# Patient Record
Sex: Female | Born: 1990 | Race: White | Hispanic: No | Marital: Married | Smoking: Never smoker
Health system: Southern US, Community
[De-identification: ages and names within clinical notes are randomized; demographics above are authoritative.]

## PROBLEM LIST (undated history)

## (undated) DIAGNOSIS — Z789 Other specified health status: Secondary | ICD-10-CM

## (undated) HISTORY — PX: NO PAST SURGERIES: SHX2092

---

## 2019-07-03 ENCOUNTER — Other Ambulatory Visit: Payer: Self-pay

## 2019-07-03 ENCOUNTER — Inpatient Hospital Stay: Payer: Medicaid Other

## 2019-07-03 ENCOUNTER — Encounter: Payer: Self-pay | Admitting: Obstetrics and Gynecology

## 2019-07-03 ENCOUNTER — Inpatient Hospital Stay
Admission: EM | Admit: 2019-07-03 | Discharge: 2019-07-05 | DRG: 788 | Disposition: A | Discharge status: Home / Self Care | Payer: Medicaid Other | Attending: Obstetrics and Gynecology | Admitting: Obstetrics and Gynecology

## 2019-07-03 DIAGNOSIS — O0930 Supervision of pregnancy with insufficient antenatal care, unspecified trimester: Principal | ICD-10-CM

## 2019-07-03 DIAGNOSIS — Z3A42 42 weeks gestation of pregnancy: Secondary | ICD-10-CM | POA: Diagnosis not present

## 2019-07-03 DIAGNOSIS — O0933 Supervision of pregnancy with insufficient antenatal care, third trimester: Secondary | ICD-10-CM | POA: Diagnosis not present

## 2019-07-03 DIAGNOSIS — Z3490 Encounter for supervision of normal pregnancy, unspecified, unspecified trimester: Secondary | ICD-10-CM

## 2019-07-03 DIAGNOSIS — O99824 Streptococcus B carrier state complicating childbirth: Secondary | ICD-10-CM | POA: Diagnosis present

## 2019-07-03 DIAGNOSIS — Z20822 Contact with and (suspected) exposure to covid-19: Secondary | ICD-10-CM | POA: Diagnosis present

## 2019-07-03 DIAGNOSIS — O4290 Premature rupture of membranes, unspecified as to length of time between rupture and onset of labor, unspecified weeks of gestation: Secondary | ICD-10-CM | POA: Diagnosis present

## 2019-07-03 DIAGNOSIS — O3663X0 Maternal care for excessive fetal growth, third trimester, not applicable or unspecified: Secondary | ICD-10-CM | POA: Diagnosis present

## 2019-07-03 DIAGNOSIS — O480 Post-term pregnancy: Secondary | ICD-10-CM | POA: Diagnosis present

## 2019-07-03 DIAGNOSIS — O4292 Full-term premature rupture of membranes, unspecified as to length of time between rupture and onset of labor: Secondary | ICD-10-CM | POA: Diagnosis present

## 2019-07-03 HISTORY — DX: Other specified health status: Z78.9

## 2019-07-03 LAB — GROUP B STREP BY PCR: Group B strep by PCR: POSITIVE — AB

## 2019-07-03 LAB — RESPIRATORY PANEL BY RT PCR (FLU A&B, COVID)
Influenza A by PCR: NEGATIVE
Influenza B by PCR: NEGATIVE
SARS Coronavirus 2 by RT PCR: NEGATIVE

## 2019-07-03 LAB — RUPTURE OF MEMBRANE (ROM)PLUS: Rom Plus: POSITIVE

## 2019-07-03 MED ORDER — BUTORPHANOL TARTRATE 1 MG/ML IJ SOLN: 1.0000 mg | INTRAMUSCULAR | Status: DC | PRN

## 2019-07-03 MED ORDER — MISOPROSTOL 50MCG HALF TABLET: 50.0000 ug | ORAL_TABLET | ORAL | Status: DC

## 2019-07-03 MED ORDER — OXYTOCIN 40 UNITS IN NORMAL SALINE INFUSION - SIMPLE MED
2.5000 [IU]/h | INTRAVENOUS | Status: DC
  Administered 2019-07-04: 2.5 [IU]/h via INTRAVENOUS
  Filled 2019-07-03: qty 1000

## 2019-07-03 MED ORDER — SODIUM CHLORIDE 0.9 % IV SOLN
2.0000 g | Freq: Once | INTRAVENOUS | Status: AC
  Administered 2019-07-04: 2 g via INTRAVENOUS
  Filled 2019-07-03: qty 2000

## 2019-07-03 MED ORDER — OXYTOCIN BOLUS FROM INFUSION: 500.0000 mL | Freq: Once | INTRAVENOUS | Status: DC

## 2019-07-03 MED ORDER — LIDOCAINE HCL (PF) 1 % IJ SOLN: 30.0000 mL | INTRAMUSCULAR | Status: DC | PRN

## 2019-07-03 MED ORDER — SOD CITRATE-CITRIC ACID 500-334 MG/5ML PO SOLN
30.0000 mL | ORAL | Status: DC | PRN
  Administered 2019-07-04: 03:00:00 30 mL via ORAL
  Filled 2019-07-03: qty 30

## 2019-07-03 MED ORDER — ACETAMINOPHEN 325 MG PO TABS: 650.0000 mg | ORAL_TABLET | ORAL | Status: DC | PRN

## 2019-07-03 MED ORDER — LACTATED RINGERS IV SOLN
500.0000 mL | INTRAVENOUS | Status: DC | PRN
  Administered 2019-07-03: 1000 mL via INTRAVENOUS

## 2019-07-03 MED ORDER — LACTATED RINGERS IV SOLN: INTRAVENOUS | Status: DC

## 2019-07-03 MED ORDER — ONDANSETRON HCL 4 MG/2ML IJ SOLN
4.0000 mg | Freq: Four times a day (QID) | INTRAMUSCULAR | Status: DC | PRN
  Administered 2019-07-04: 03:00:00 4 mg via INTRAVENOUS

## 2019-07-03 NOTE — OB Triage Note (Signed)
Pt is a G2P1 and approx. 42 weeks. Pt reports LOF since 07/01/19 and states it was clear/pink tinge. Pt denies VB. Pt confirms positive fetal movement. Pt states her care has been followed by a midwife and planning to deliver at home. Pt states her first baby was born a day before due date without complication. VSS. Initial FHT 170. Monitors applied and assessing.

## 2019-07-04 ENCOUNTER — Encounter: Payer: Self-pay | Admitting: Obstetrics and Gynecology

## 2019-07-04 ENCOUNTER — Inpatient Hospital Stay: Payer: Medicaid Other | Admitting: Anesthesiology

## 2019-07-04 ENCOUNTER — Encounter
Admission: EM | Disposition: A | Discharge status: Home / Self Care | Payer: Self-pay | Source: Home / Self Care | Attending: Obstetrics and Gynecology

## 2019-07-04 DIAGNOSIS — O4292 Full-term premature rupture of membranes, unspecified as to length of time between rupture and onset of labor: Principal | ICD-10-CM

## 2019-07-04 DIAGNOSIS — O0933 Supervision of pregnancy with insufficient antenatal care, third trimester: Secondary | ICD-10-CM

## 2019-07-04 DIAGNOSIS — Z3A42 42 weeks gestation of pregnancy: Secondary | ICD-10-CM

## 2019-07-04 LAB — CBC
HCT: 25.7 % — ABNORMAL LOW (ref 36.0–46.0)
HCT: 35.9 % — ABNORMAL LOW (ref 36.0–46.0)
Hemoglobin: 11.9 g/dL — ABNORMAL LOW (ref 12.0–15.0)
Hemoglobin: 8.4 g/dL — ABNORMAL LOW (ref 12.0–15.0)
MCH: 30.7 pg (ref 26.0–34.0)
MCH: 30.8 pg (ref 26.0–34.0)
MCHC: 32.7 g/dL (ref 30.0–36.0)
MCHC: 33.1 g/dL (ref 30.0–36.0)
MCV: 92.5 fL (ref 80.0–100.0)
MCV: 94.1 fL (ref 80.0–100.0)
Platelets: 167 10*3/uL (ref 150–400)
Platelets: 227 10*3/uL (ref 150–400)
RBC: 2.73 MIL/uL — ABNORMAL LOW (ref 3.87–5.11)
RBC: 3.88 MIL/uL (ref 3.87–5.11)
RDW: 12.6 % (ref 11.5–15.5)
RDW: 12.7 % (ref 11.5–15.5)
WBC: 13.3 10*3/uL — ABNORMAL HIGH (ref 4.0–10.5)
WBC: 7.5 10*3/uL (ref 4.0–10.5)
nRBC: 0 % (ref 0.0–0.2)
nRBC: 0 % (ref 0.0–0.2)

## 2019-07-04 LAB — DIFFERENTIAL
Abs Immature Granulocytes: 0.02 10*3/uL (ref 0.00–0.07)
Basophils Absolute: 0 10*3/uL (ref 0.0–0.1)
Basophils Relative: 0 %
Eosinophils Absolute: 0.1 10*3/uL (ref 0.0–0.5)
Eosinophils Relative: 1 %
Immature Granulocytes: 0 %
Lymphocytes Relative: 22 %
Lymphs Abs: 1.7 10*3/uL (ref 0.7–4.0)
Monocytes Absolute: 0.5 10*3/uL (ref 0.1–1.0)
Monocytes Relative: 6 %
Neutro Abs: 5.3 10*3/uL (ref 1.7–7.7)
Neutrophils Relative %: 71 %

## 2019-07-04 LAB — URINE DRUG SCREEN, QUALITATIVE (ARMC ONLY)
Amphetamines, Ur Screen: NOT DETECTED
Barbiturates, Ur Screen: NOT DETECTED
Benzodiazepine, Ur Scrn: NOT DETECTED
Cannabinoid 50 Ng, Ur ~~LOC~~: NOT DETECTED
Cocaine Metabolite,Ur ~~LOC~~: NOT DETECTED
MDMA (Ecstasy)Ur Screen: NOT DETECTED
Methadone Scn, Ur: NOT DETECTED
Opiate, Ur Screen: NOT DETECTED
Phencyclidine (PCP) Ur S: NOT DETECTED
Tricyclic, Ur Screen: NOT DETECTED

## 2019-07-04 LAB — RAPID HIV SCREEN (HIV 1/2 AB+AG)
HIV 1/2 Antibodies: NONREACTIVE
HIV-1 P24 Antigen - HIV24: NONREACTIVE

## 2019-07-04 LAB — RPR: RPR Ser Ql: NONREACTIVE

## 2019-07-04 LAB — HEPATITIS B SURFACE ANTIGEN: Hepatitis B Surface Ag: NONREACTIVE

## 2019-07-04 LAB — ABO/RH: ABO/RH(D): O POS

## 2019-07-04 SURGERY — Surgical Case
Anesthesia: Spinal

## 2019-07-04 MED ORDER — NALBUPHINE HCL 10 MG/ML IJ SOLN: 5.0000 mg | Freq: Once | INTRAMUSCULAR | Status: DC | PRN

## 2019-07-04 MED ORDER — ZOLPIDEM TARTRATE 5 MG PO TABS: 5.0000 mg | ORAL_TABLET | Freq: Every evening | ORAL | Status: DC | PRN

## 2019-07-04 MED ORDER — DIPHENHYDRAMINE HCL 25 MG PO CAPS: 25.0000 mg | ORAL_CAPSULE | Freq: Four times a day (QID) | ORAL | Status: DC | PRN

## 2019-07-04 MED ORDER — DEXAMETHASONE SODIUM PHOSPHATE 10 MG/ML IJ SOLN
INTRAMUSCULAR | Status: DC | PRN
  Administered 2019-07-04: 5 mg via INTRAVENOUS

## 2019-07-04 MED ORDER — OXYCODONE-ACETAMINOPHEN 5-325 MG PO TABS: 1.0000 | ORAL_TABLET | ORAL | Status: DC | PRN

## 2019-07-04 MED ORDER — PHENYLEPHRINE 40 MCG/ML (10ML) SYRINGE FOR IV PUSH (FOR BLOOD PRESSURE SUPPORT)
PREFILLED_SYRINGE | INTRAVENOUS | Status: DC | PRN
  Administered 2019-07-04: 100 ug via INTRAVENOUS
  Administered 2019-07-04 (×2): 200 ug via INTRAVENOUS

## 2019-07-04 MED ORDER — OXYCODONE HCL 5 MG PO TABS: 5.0000 mg | ORAL_TABLET | Freq: Once | ORAL | Status: DC | PRN

## 2019-07-04 MED ORDER — CEFAZOLIN SODIUM-DEXTROSE 2-4 GM/100ML-% IV SOLN
2.0000 g | INTRAVENOUS | Status: AC
  Administered 2019-07-04: 03:00:00 2 g via INTRAVENOUS
  Filled 2019-07-04: qty 100

## 2019-07-04 MED ORDER — LACTATED RINGERS IV SOLN: INTRAVENOUS | Status: DC

## 2019-07-04 MED ORDER — ACETAMINOPHEN 325 MG PO TABS: 650.0000 mg | ORAL_TABLET | Freq: Four times a day (QID) | ORAL | Status: DC

## 2019-07-04 MED ORDER — KETOROLAC TROMETHAMINE 30 MG/ML IJ SOLN
30.0000 mg | Freq: Four times a day (QID) | INTRAMUSCULAR | Status: AC
  Filled 2019-07-04: qty 1

## 2019-07-04 MED ORDER — FENTANYL CITRATE (PF) 100 MCG/2ML IJ SOLN: 25.0000 ug | INTRAMUSCULAR | Status: DC | PRN

## 2019-07-04 MED ORDER — OXYCODONE HCL 5 MG/5ML PO SOLN: 5.0000 mg | Freq: Once | ORAL | Status: DC | PRN

## 2019-07-04 MED ORDER — PRENATAL MULTIVITAMIN CH
1.0000 | ORAL_TABLET | Freq: Every day | ORAL | Status: DC
  Administered 2019-07-04: 1 via ORAL
  Filled 2019-07-04: qty 1

## 2019-07-04 MED ORDER — SIMETHICONE 80 MG PO CHEW
80.0000 mg | CHEWABLE_TABLET | Freq: Four times a day (QID) | ORAL | Status: DC
  Administered 2019-07-04 – 2019-07-05 (×4): 80 mg via ORAL
  Filled 2019-07-04 (×4): qty 1

## 2019-07-04 MED ORDER — SENNOSIDES-DOCUSATE SODIUM 8.6-50 MG PO TABS: 2.0000 | ORAL_TABLET | ORAL | Status: DC

## 2019-07-04 MED ORDER — KETOROLAC TROMETHAMINE 30 MG/ML IJ SOLN
INTRAMUSCULAR | Status: DC | PRN
  Administered 2019-07-04: 30 mg via INTRAVENOUS

## 2019-07-04 MED ORDER — DIPHENHYDRAMINE HCL 50 MG/ML IJ SOLN: 12.5000 mg | INTRAMUSCULAR | Status: DC | PRN

## 2019-07-04 MED ORDER — OXYTOCIN 40 UNITS IN NORMAL SALINE INFUSION - SIMPLE MED
INTRAVENOUS | Status: DC | PRN
  Administered 2019-07-04: 1000 mL/h via INTRAVENOUS

## 2019-07-04 MED ORDER — DIPHENHYDRAMINE HCL 25 MG PO CAPS: 25.0000 mg | ORAL_CAPSULE | ORAL | Status: DC | PRN

## 2019-07-04 MED ORDER — ONDANSETRON HCL 4 MG/2ML IJ SOLN: 4.0000 mg | Freq: Three times a day (TID) | INTRAMUSCULAR | Status: DC | PRN

## 2019-07-04 MED ORDER — FENTANYL CITRATE (PF) 100 MCG/2ML IJ SOLN
INTRAMUSCULAR | Status: AC
  Filled 2019-07-04: qty 2

## 2019-07-04 MED ORDER — NALOXONE HCL 4 MG/10ML IJ SOLN
1.0000 ug/kg/h | INTRAVENOUS | Status: DC | PRN
  Filled 2019-07-04: qty 5

## 2019-07-04 MED ORDER — NALBUPHINE HCL 10 MG/ML IJ SOLN: 5.0000 mg | INTRAMUSCULAR | Status: DC | PRN

## 2019-07-04 MED ORDER — LIDOCAINE 5 % EX PTCH
MEDICATED_PATCH | CUTANEOUS | Status: AC
  Filled 2019-07-04: qty 1

## 2019-07-04 MED ORDER — BUPIVACAINE IN DEXTROSE 0.75-8.25 % IT SOLN
INTRATHECAL | Status: DC | PRN
  Administered 2019-07-04: 1.6 mL via INTRATHECAL

## 2019-07-04 MED ORDER — SODIUM CHLORIDE 0.9% FLUSH: 3.0000 mL | INTRAVENOUS | Status: DC | PRN

## 2019-07-04 MED ORDER — OXYCODONE HCL 5 MG PO TABS: 5.0000 mg | ORAL_TABLET | Freq: Four times a day (QID) | ORAL | Status: AC | PRN

## 2019-07-04 MED ORDER — IBUPROFEN 800 MG PO TABS
800.0000 mg | ORAL_TABLET | Freq: Three times a day (TID) | ORAL | Status: DC
  Administered 2019-07-05: 09:00:00 800 mg via ORAL
  Filled 2019-07-04: qty 1

## 2019-07-04 MED ORDER — ACETAMINOPHEN 500 MG PO TABS
1000.0000 mg | ORAL_TABLET | Freq: Four times a day (QID) | ORAL | Status: DC
  Filled 2019-07-04: qty 2

## 2019-07-04 MED ORDER — OXYTOCIN 40 UNITS IN NORMAL SALINE INFUSION - SIMPLE MED: 2.5000 [IU]/h | INTRAVENOUS | Status: AC

## 2019-07-04 MED ORDER — MORPHINE SULFATE (PF) 0.5 MG/ML IJ SOLN
INTRAMUSCULAR | Status: AC
  Filled 2019-07-04: qty 10

## 2019-07-04 MED ORDER — KETOROLAC TROMETHAMINE 30 MG/ML IJ SOLN: 30.0000 mg | Freq: Four times a day (QID) | INTRAMUSCULAR | Status: AC

## 2019-07-04 MED ORDER — FENTANYL CITRATE (PF) 100 MCG/2ML IJ SOLN
INTRAMUSCULAR | Status: DC | PRN
  Administered 2019-07-04: 15 ug via INTRAVENOUS

## 2019-07-04 MED ORDER — SODIUM CHLORIDE 0.9 % IV SOLN
INTRAVENOUS | Status: DC | PRN
  Administered 2019-07-04: 30 ug/min via INTRAVENOUS

## 2019-07-04 MED ORDER — LIDOCAINE 5 % EX PTCH
MEDICATED_PATCH | CUTANEOUS | Status: DC | PRN
  Administered 2019-07-04: 1 via TRANSDERMAL

## 2019-07-04 MED ORDER — MORPHINE SULFATE (PF) 0.5 MG/ML IJ SOLN
INTRAMUSCULAR | Status: DC | PRN
  Administered 2019-07-04: 100 ug via EPIDURAL

## 2019-07-04 MED ORDER — MEPERIDINE HCL 25 MG/ML IJ SOLN: 6.2500 mg | INTRAMUSCULAR | Status: DC | PRN

## 2019-07-04 MED ORDER — MENTHOL 3 MG MT LOZG
1.0000 | LOZENGE | OROMUCOSAL | Status: DC | PRN
  Filled 2019-07-04: qty 9

## 2019-07-04 MED ORDER — NALOXONE HCL 0.4 MG/ML IJ SOLN: 0.4000 mg | INTRAMUSCULAR | Status: DC | PRN

## 2019-07-04 SURGICAL SUPPLY — 26 items
ADHESIVE MASTISOL STRL (MISCELLANEOUS) ×3 IMPLANT
BAG COUNTER SPONGE EZ (MISCELLANEOUS) ×2 IMPLANT
CANISTER SUCT 3000ML PPV (MISCELLANEOUS) ×3 IMPLANT
CHLORAPREP W/TINT 26 (MISCELLANEOUS) ×6 IMPLANT
COUNTER SPONGE BAG EZ (MISCELLANEOUS) ×1
COVER WAND RF STERILE (DRAPES) ×3 IMPLANT
DRSG TELFA 3X8 NADH (GAUZE/BANDAGES/DRESSINGS) ×3 IMPLANT
GAUZE SPONGE 4X4 12PLY STRL (GAUZE/BANDAGES/DRESSINGS) ×3 IMPLANT
GLOVE BIOGEL PI ORTHO PRO 7.5 (GLOVE) ×2
GLOVE PI ORTHO PRO STRL 7.5 (GLOVE) ×1 IMPLANT
GOWN STRL REUS W/ TWL LRG LVL3 (GOWN DISPOSABLE) ×2 IMPLANT
GOWN STRL REUS W/TWL LRG LVL3 (GOWN DISPOSABLE) ×4
KIT TURNOVER KIT A (KITS) ×3 IMPLANT
NS IRRIG 1000ML POUR BTL (IV SOLUTION) ×3 IMPLANT
PACK C SECTION AR (MISCELLANEOUS) ×3 IMPLANT
PAD OB MATERNITY 4.3X12.25 (PERSONAL CARE ITEMS) ×3 IMPLANT
PAD PREP 24X41 OB/GYN DISP (PERSONAL CARE ITEMS) ×3 IMPLANT
PENCIL SMOKE ULTRAEVAC 22 CON (MISCELLANEOUS) ×3 IMPLANT
RETRACTOR WND ALEXIS-O 25 LRG (MISCELLANEOUS) ×1 IMPLANT
RTRCTR C-SECT PINK 25CM LRG (MISCELLANEOUS) ×3 IMPLANT
RTRCTR WOUND ALEXIS O 25CM LRG (MISCELLANEOUS) ×3
SPONGE LAP 18X18 RF (DISPOSABLE) ×3 IMPLANT
SUT VIC AB 0 CTX 36 (SUTURE) ×4
SUT VIC AB 0 CTX36XBRD ANBCTRL (SUTURE) ×2 IMPLANT
SUT VIC AB 1 CT1 36 (SUTURE) ×6 IMPLANT
SUT VICRYL+ 3-0 36IN CT-1 (SUTURE) ×6 IMPLANT

## 2019-07-04 NOTE — Op Note (Signed)
      OP NOTE  Date: 07/04/2019   4:08 AM Name Deanna Spears MR# [id number]  Preoperative Diagnosis: 1. Intrauterine pregnancy at [redacted]w[redacted]d Active Problems:   Pregnancy   PROM (premature rupture of membranes)   Macrosomia  2.  non-reassuring fetal status and hydramnios  Postoperative Diagnosis: 1. Intrauterine pregnancy at [redacted]w[redacted]d, delivered 2. Viable infant 3. Remainder same as pre-op 4. Tight nuchal cord X 2   Procedure: 1. Primary Low-Transverse Cesarean Section  Surgeon: Elonda Husky, MD  Assistant:  Pattricia Boss Thomnpson  No other capable assistant available for this surgery which requires an experienced, high level assistant.   Anesthesia: Spinal    EBL: 450  ml     Findings: 1) female infant, Apgar scores of 2   at 1 minute and 8   at 5 minutes and a birthweight of 133.69  ounces.    2) Normal uterus, tubes and ovaries.    Procedure:  The patient was prepped and draped in the supine position and placed under spinal anesthesia.  A transverse incision was made across the abdomen in a Pfannenstiel manner. If indicated the old scar was systematically removed with sharp dissection.  We carried the dissection down to the level of the fascia.  The fascia was incised in a curvilinear manner.  The fascia was then elevated from the rectus muscles with blunt and sharp dissection.  The rectus muscles were separated laterally exposing the peritoneum.  The peritoneum was carefully entered with care being taken to avoid bowel and bladder.  A self-retaining retractor was placed.  The visceral peritoneum was incised in a curvilinear fashion across the lower uterine segment creating a bladder flap. A transverse incision was made across the lower uterine segment and extended laterally and superiorly using the bandage scissors.  Artificial rupture membranes was performed and a large amount of Clear fluid was noted.  The infant was delivered from the cephalic position.  A very tight nuchal cord  was present X 2. After an appropriate time interval, the cord was doubly clamped and cut. Cord blood was obtained if required.  The infant was handed to the pediatric personnel  who then placed the infant under heat lamps where it was cleaned dried and suctioned as needed. The placenta was delivered. The hysterotomy incision was then identified on ring forceps.  The uterine cavity was cleaned with a moist lap sponge.  The hysterotomy incision was closed with a running interlocking suture of Vicryl.  Hemostasis was excellent.  Pitocin was run in the IV and the uterus was found to be firm. The posterior cul-de-sac and gutters were cleaned and inspected.  Hemostasis was noted.  The fascia was then closed with a running suture of #1 Vicryl.  Hemostasis of the subcutaneous tissues was obtained using the Bovie.  The subcutaneous tissues were closed with a running suture of 000 Vicryl.  A subcuticular suture was placed.  Steri-Strips were applied in the usual manner.  A pressure dressing was placed.  The patient went to the recovery room in stable condition.   Elonda Husky, M.D. 07/04/2019 4:08 AM

## 2019-07-04 NOTE — Lactation Note (Addendum)
This note was copied from a baby's chart. Lactation Consultation Note  Patient Name: Deanna Spears HUDJS'H Date: 07/04/2019 Reason for consult: Initial assessment;Term  Observed mom breast feeding [name] in comfortable position with pillow support.  She has strong, rhythmic, deep suction.  FOB supportive and helpful. Mom recognizing feeding cues and puts [name] to the breast whenever she demonstrates hunger cues.  Mom and baby are 0+ with (+) coombs, but Vivian's first transcutaneous bilirubin at 6 hours was 1.1.   Mom reports tender nipples.  Demonstrated hand expression of several drops of colostrum before and after breast feeding.  Encouraged mom to rub colostrum on nipples after breast feed to prevent bacteria, lubricate and for discomfort.  Coconut oil given and instructed in use.  Mom is experienced breast feeder.  She breast fed her first baby for 13 months.  Right after she came home, mom was readmitted to the hospital.  Mom's sister breast fed the baby until mom was back home, so she had to pump for a while.  Mom's drug screen on admission 07/03/19 was negative, but baby's cord blood was drawn for drug detection panel.  Reviewed normal newborn stomach size, supply and demand, adequate output, normal course of lactation and routine newborn feeding patterns.  Lactation Government social research officer given with contact number and reviewed and lactation name and number written on white board and encouraged to call with any questions, concerns or if needed assistance.  Maternal Data Formula Feeding for Exclusion: No Has patient been taught Hand Expression?: Yes Does the patient have breastfeeding experience prior to this delivery?: Yes  Feeding Feeding Type: Breast Fed  LATCH Score Latch: Grasps breast easily, tongue down, lips flanged, rhythmical sucking.  Audible Swallowing: A few with stimulation  Type of Nipple: Everted at rest and after stimulation  Comfort (Breast/Nipple): Filling,  red/small blisters or bruises, mild/mod discomfort  Hold (Positioning): No assistance needed to correctly position infant at breast.  LATCH Score: 8  Interventions Interventions: Breast feeding basics reviewed;Assisted with latch;Skin to skin;Breast massage;Breast compression;Support pillows;Coconut oil  Lactation Tools Discussed/Used Tools: Coconut oil WIC Program: No   Consult Status Consult Status: PRN    Louis Meckel 07/04/2019, 3:57 PM

## 2019-07-04 NOTE — H&P (Signed)
History and Physical   HPI  Deanna Spears is a 29 y.o. G2P1001 at [redacted]w[redacted]d Estimated Date of Delivery: 06/18/19 who is being admitted for PROM.  Her prenatal care is complicated by lack of prenatal care.  She was attempting to have a home delivery but when she went over [redacted] weeks gestation and her midwife did a test showing ruptured membranes the patient decided to come to the hospital for the remainder of her care. She has had no ultrasounds during this prenatal care and her gestation is unknown.  She was not tested for gestational diabetes during this pregnancy. She underwent an emergent ultrasound here in labor and delivery showing a very macrosomic baby greater than 4500 g and greater than 37 cm of amniotic fluid.  This fluid despite ROM plus being positive.  The patient is not sure but she thinks that she began leaking amniotic fluid on Friday. She denies contractions. She has no available prenatal blood work on admission.   OB History  OB History  Gravida Para Term Preterm AB Living  2 1 1  0 0 1  SAB TAB Ectopic Multiple Live Births  0 0 0 0 1    # Outcome Date GA Lbr Len/2nd Weight Sex Delivery Anes PTL Lv  2 Current           1 Term     F Vag-Spont   LIV     Complications: Short cord    PROBLEM LIST  Pregnancy complications or risks: Patient Active Problem List   Diagnosis Date Noted  . Pregnancy 07/03/2019  . PROM (premature rupture of membranes) 07/03/2019    Prenatal labs and studies: ABO, Rh: --/--/O POS Performed at [facility] [place], [address] [place], [place]  [phone]) Antibody: POS (04/18 2346) Rubella:   RPR:    HBsAg:    HIV: NON REACTIVE (04/18 2346)  09-30-1998-- (04/18 2204)   Past Medical History:  Diagnosis Date  . Medical history non-contributory      Past Surgical History:  Procedure Laterality Date  . NO PAST SURGERIES       Medications    Current Discharge Medication List    CONTINUE these  medications which have NOT CHANGED   Details  ferrous sulfate 325 (65 FE) MG tablet Take 325 mg by mouth daily with breakfast.    Prenatal Vit-Fe Fumarate-FA (PRENATAL MULTIVITAMIN) TABS tablet Take 1 tablet by mouth daily at 12 noon.         Allergies  Patient has no known allergies.  Review of Systems  Pertinent items are noted in HPI.  Physical Exam  BP (!) 108/55   Pulse 81   Temp 98.1 F (36.7 C) (Oral)   Resp 16   Ht 5\' 4"  (1.626 m)   Wt 93 kg   LMP 09/11/2018 (Approximate)   BMI 35.19 kg/m   Lungs:  CTA B Cardio: RRR without M/R/G Abd: Soft, gravid, NT Presentation: cephalic EXT: No C/C/ 1+ Edema DTRs: 2+ B CERVIX: Dilation: 3 Effacement (%): 60 Cervical Position: Posterior, Middle Station: -3 Presentation: Vertex Exam by:: S Sanchez       FHR:  Variability: Fair (1-6 bpm)   category 2-despite greater than 2 hours of fetal monitoring there has been no 15 x 15 accelerations noted. Toco: Uterine Contractions: Occasional but patient is not feeling contractions.  Test Results  Results for orders placed or performed during the hospital encounter of 07/03/19 (from the past 24 hour(s))  ROM Plus ([facility] only)     Status: None   Collection Time: 07/03/19 10:04 PM  Result Value Ref Range   Rom Plus POSITIVE   Group B strep by PCR     Status: Abnormal   Collection Time: 07/03/19 10:04 PM  Result Value Ref Range   Group B strep by PCR POSITIVE (A) NEGATIVE  Respiratory Panel by RT PCR (Flu A&B, Covid) - Nasopharyngeal Swab     Status: None   Collection Time: 07/03/19 11:03 PM   Specimen: Nasopharyngeal Swab  Result Value Ref Range   SARS Coronavirus 2 by RT PCR NEGATIVE NEGATIVE   Influenza A by PCR NEGATIVE NEGATIVE   Influenza B by PCR NEGATIVE NEGATIVE  Urine Drug Screen, Qualitative ([facility] only)     Status: None   Collection Time: 07/03/19 11:27 PM  Result Value Ref Range   Tricyclic, Ur Screen NONE DETECTED NONE DETECTED   Amphetamines, Ur  Screen NONE DETECTED NONE DETECTED   MDMA (Ecstasy)Ur Screen NONE DETECTED NONE DETECTED   Cocaine Metabolite,Ur [place] NONE DETECTED NONE DETECTED   Opiate, Ur Screen NONE DETECTED NONE DETECTED   Phencyclidine (PCP) Ur S NONE DETECTED NONE DETECTED   Cannabinoid 50 Ng, Ur [place] NONE DETECTED NONE DETECTED   Barbiturates, Ur Screen NONE DETECTED NONE DETECTED   Benzodiazepine, Ur Scrn NONE DETECTED NONE DETECTED   Methadone Scn, Ur NONE DETECTED NONE DETECTED  Differential     Status: None   Collection Time: 07/03/19 11:46 PM  Result Value Ref Range   Neutrophils Relative % 71 %   Neutro Abs 5.3 1.7 - 7.7 K/uL   Lymphocytes Relative 22 %   Lymphs Abs 1.7 0.7 - 4.0 K/uL   Monocytes Relative 6 %   Monocytes Absolute 0.5 0.1 - 1.0 K/uL   Eosinophils Relative 1 %   Eosinophils Absolute 0.1 0.0 - 0.5 K/uL   Basophils Relative 0 %   Basophils Absolute 0.0 0.0 - 0.1 K/uL   Immature Granulocytes 0 %   Abs Immature Granulocytes 0.02 0.00 - 0.07 K/uL  Rapid HIV screen (HIV 1/2 Ab+Ag)     Status: None   Collection Time: 07/03/19 11:46 PM  Result Value Ref Range   HIV-1 P24 Antigen - HIV24 NON REACTIVE NON REACTIVE   HIV 1/2 Antibodies NON REACTIVE NON REACTIVE   Interpretation (HIV Ag Ab)      A non reactive test result means that HIV 1 or HIV 2 antibodies and HIV 1 p24 antigen were not detected in the specimen.  CBC     Status: Abnormal   Collection Time: 07/03/19 11:46 PM  Result Value Ref Range   WBC 7.5 4.0 - 10.5 K/uL   RBC 2.73 (L) 3.87 - 5.11 MIL/uL   Hemoglobin 8.4 (L) 12.0 - 15.0 g/dL   HCT 91.4 (L) 78.2 - 95.6 %   MCV 94.1 80.0 - 100.0 fL   MCH 30.8 26.0 - 34.0 pg   MCHC 32.7 30.0 - 36.0 g/dL   RDW 21.3 08.6 - 57.8 %   Platelets 167 150 - 400 K/uL   nRBC 0.0 0.0 - 0.2 %  Type and screen [facility]     Status: None (Preliminary result)   Collection Time: 07/03/19 11:46 PM  Result Value Ref Range   ABO/RH(D) O POS    Antibody Screen POS    Sample  Expiration      07/06/2019,2359 Performed at [facility], [address], [place], [place] [address]  Antibody Identification PENDING   ABO/Rh     Status: None   Collection Time: 07/04/19 12:34 AM  Result Value Ref Range   ABO/RH(D)      O POS Performed at [facility], [place], [place] [address]    Group B Strep positive  Assessment   G2P1001 at [redacted]w[redacted]d Estimated Date of Delivery: 06/18/19  The fetus is reassuring.   Patient Active Problem List   Diagnosis Date Noted  . Pregnancy 07/03/2019  . PROM (premature rupture of membranes) 07/03/2019    Plan  1. Admit to L&D :   2. EFM: -- Category 2 3.  Discussed care with Dr. Annamaria Boots MFM.  His recommendation was for primary cesarean delivery.  Long discussion followed reviewing contingency plans if patient declined.  His major take away was that macrosomia with polyhydramnios is most commonly caused by uncontrolled gestational diabetes and this makes the patient at high risk for shoulder dystocia. 4. Admission labs  -prenatal labs  5.  Antibiotics for GBS 6.  After a lengthy discussion with the parents and the risks and benefits of cesarean delivery versus attempted vaginal delivery they have decided to have a primary cesarean delivery.  All their questions were answered.  Finis Bud, M.D. 07/04/2019 2:18 AM

## 2019-07-04 NOTE — Progress Notes (Addendum)
Transferred pt to [facility] post primary c/s. Pt stable upon transport. RN accompanied pt. Pt denies any further needs at this time.

## 2019-07-04 NOTE — Anesthesia Post-op Follow-up Note (Signed)
  Anesthesia Pain Follow-up Note  Patient: Deanna Spears  Day #: 1  Date of Follow-up: 07/04/2019 Time: 8:26 AM  Last Vitals:  Vitals:   07/04/19 0707 07/04/19 0708  BP:    Pulse: 80 79  Resp:    Temp:    SpO2: 93% 93%    Level of Consciousness: alert  Pain: mild   Side Effects:None  Catheter Site Exam:clean     Plan: D/C from anesthesia care at surgeon's request  Rica Mast

## 2019-07-04 NOTE — Transfer of Care (Signed)
Immediate Anesthesia Transfer of Care Note  Patient: Deanna Spears  Procedure(s) Performed: CESAREAN SECTION (N/A )  Patient Location: PACU  Anesthesia Type:Spinal  Level of Consciousness: awake, alert  and sedated  Airway & Oxygen Therapy: Patient Spontanous Breathing  Post-op Assessment: Report given to RN and Post -op Vital signs reviewed and stable  Post vital signs: Reviewed and stable  Last Vitals:  Vitals Value Taken Time  BP    Temp    Pulse 96 07/04/19 0427  Resp 15 07/04/19 0427  SpO2 97 % 07/04/19 0427  Vitals shown include unvalidated device data.  Last Pain:  Vitals:   07/04/19 0218  TempSrc: Oral  PainSc: 0-No pain         Complications: No apparent anesthesia complications

## 2019-07-04 NOTE — Anesthesia Preprocedure Evaluation (Signed)
Anesthesia Evaluation  Patient identified by MRN, date of birth, ID band Patient awake    Reviewed: Allergy & Precautions, H&P , NPO status , Patient's Chart, lab work & pertinent test results  History of Anesthesia Complications Negative for: history of anesthetic complications  Airway Mallampati: III  TM Distance: >3 FB Neck ROM: full    Dental  (+) Chipped   Pulmonary neg pulmonary ROS, neg shortness of breath,           Cardiovascular Exercise Tolerance: Good (-) hypertensionnegative cardio ROS       Neuro/Psych    GI/Hepatic negative GI ROS,   Endo/Other    Renal/GU   negative genitourinary   Musculoskeletal   Abdominal   Peds  Hematology negative hematology ROS (+)   Anesthesia Other Findings Past Medical History: No date: Medical history non-contributory  Past Surgical History: No date: NO PAST SURGERIES  BMI    Body Mass Index: 35.19 kg/m      Reproductive/Obstetrics (+) Pregnancy                             Anesthesia Physical Anesthesia Plan  ASA: II and emergent  Anesthesia Plan: Spinal   Post-op Pain Management:    Induction:   PONV Risk Score and Plan:   Airway Management Planned: Natural Airway and Nasal Cannula  Additional Equipment:   Intra-op Plan:   Post-operative Plan:   Informed Consent: I have reviewed the patients History and Physical, chart, labs and discussed the procedure including the risks, benefits and alternatives for the proposed anesthesia with the patient or authorized representative who has indicated his/her understanding and acceptance.     Dental Advisory Given  Plan Discussed with: Anesthesiologist, CRNA and Surgeon  Anesthesia Plan Comments: (Patient reports no bleeding problems and no anticoagulant use.  Plan for spinal with backup GA  Patient consented for risks of anesthesia including but not limited to:  - adverse  reactions to medications - damage to eyes, teeth, lips or other oral mucosa - nerve damage due to positioning  - risk of bleeding, infection, nerve damage and headache - risk of failed spinal - damage to teeth, lips or other oral mucosa - sore throat or hoarseness - damage to heart, brain, nerves, lungs or loss of life  Patient voiced understanding.)        Anesthesia Quick Evaluation

## 2019-07-04 NOTE — Anesthesia Procedure Notes (Signed)
Spinal  Patient location during procedure: OR Start time: 07/04/2019 3:05 AM End time: 07/04/2019 3:07 AM Staffing Performed: resident/CRNA  Resident/CRNA: Justus Memory, CRNA Preanesthetic Checklist Completed: patient identified, IV checked, site marked, risks and benefits discussed, surgical consent, monitors and equipment checked, pre-op evaluation and timeout performed Spinal Block Patient position: sitting Prep: Betadine Patient monitoring: heart rate, continuous pulse ox, blood pressure and cardiac monitor Approach: midline Location: L4-5 Injection technique: single-shot Needle Needle type: Whitacre and Introducer  Needle gauge: 24 G Needle length: 9 cm Additional Notes Negative paresthesia. Negative blood return. Positive free-flowing CSF. Expiration date of kit checked and confirmed. Patient tolerated procedure well, without complications.

## 2019-07-04 NOTE — Anesthesia Postprocedure Evaluation (Signed)
Anesthesia Post Note  Patient: Deanna Spears  Procedure(s) Performed: CESAREAN SECTION (N/A )  Patient location during evaluation: Mother Baby Anesthesia Type: Spinal Level of consciousness: oriented and awake and alert Pain management: pain level controlled Vital Signs Assessment: post-procedure vital signs reviewed and stable Respiratory status: spontaneous breathing and respiratory function stable Cardiovascular status: blood pressure returned to baseline and stable Postop Assessment: no headache, no backache, no apparent nausea or vomiting and able to ambulate Anesthetic complications: no     Last Vitals:  Vitals:   07/04/19 0707 07/04/19 0708  BP:    Pulse: 80 79  Resp:    Temp:    SpO2: 93% 93%    Last Pain:  Vitals:   07/04/19 0636  TempSrc: Oral  PainSc:                  Rica Mast

## 2019-07-05 DIAGNOSIS — O0933 Supervision of pregnancy with insufficient antenatal care, third trimester: Secondary | ICD-10-CM

## 2019-07-05 DIAGNOSIS — O4292 Full-term premature rupture of membranes, unspecified as to length of time between rupture and onset of labor: Principal | ICD-10-CM

## 2019-07-05 DIAGNOSIS — Z3A42 42 weeks gestation of pregnancy: Secondary | ICD-10-CM

## 2019-07-05 LAB — TYPE AND SCREEN
ABO/RH(D): O POS
Antibody Screen: POSITIVE
PT AG Type: NEGATIVE

## 2019-07-05 LAB — RUBELLA SCREEN: Rubella: <0.9 index — ABNORMAL LOW (ref >0.99)

## 2019-07-05 NOTE — Progress Notes (Addendum)
CSW was informed by RN that MOB has insurance/Medicaid questions. CSW sent email to Financial Counseling requesting that they touch base with MOB about this.  1:50- Per RN Abby MOB is wanting to leave now and not wait for Financial Counseling. Encouraged MOB to reach out to DSS directly if needed once they get home.  RN Abby also informed CSW that MOB and Baby are leaving AMA. Family had planned for a home birth but came in for an emergent c-section. Family is not wanting to stay the 48 hours Pediatrician is wanting Baby to stay for. No safety concerns per RN. CSW called [facility] CPS and left voicemail requesting a return call to make a CPS report as required.   2:15- Received a return call from [facility] with [facility] CPS. Made CPS report.   Alfonso Ramus, [place] [phone]

## 2019-07-05 NOTE — Progress Notes (Signed)
Discharge order received from doctor. Pediatrician would not discharge infant until 38 hours of life so pt requesting for baby to leave AMA with her. Social work notified. Incision cleaning kit given and reviewed. Reviewed discharge instructions and prescriptions with patient and answered all questions. Follow up appointment instructions given. Patient verbalized understanding. ID bands checked. Patient discharged home with infant via wheelchair by nursing/auxillary.    Hilbert Bible, RN

## 2019-07-05 NOTE — Lactation Note (Signed)
This note was copied from a baby's chart. Lactation Consultation Note  Patient Name: Deanna Spears [id number] Date: 07/05/2019 Reason for consult: Follow-up assessment  LC student entered room for follow-up assessment. Upon entering, both parents present. MOB awake, holding infant skin to skin.   MOB reports that breastfeeding is going well, but that she is experiencing slight nipple tenderness. Coconut oil given. LC student supplied MOB with comfort gel for nipple tenderness and instructed her to alternate between coconut and comfort gels. MOB states that she does not have any other breastfeeding concerns at the moment.   LC student encouraged MOB to continue nursing on demand and to use coconut oil/comfort gels to heal nipples. Skin-to-skin encouraged, as well as feeding on demand. MOB encouraged to call out to in-patient lactation if further assistance is needed.   Maternal Data Formula Feeding for Exclusion: No Has patient been taught Hand Expression?: Yes Does the patient have breastfeeding experience prior to this delivery?: Yes  Feeding Feeding Type: Breast Fed  LATCH Score                   Interventions Interventions: Breast feeding basics reviewed;Skin to skin;Coconut oil;Comfort gels  Lactation Tools Discussed/Used Tools: Comfort gels;Coconut oil   Consult Status Consult Status: Follow-up Date: 07/05/19 Follow-up type: In-patient    Jimmye Norman 07/05/2019, 11:16 AM

## 2019-07-05 NOTE — Discharge Instructions (Signed)
Please call your doctor or return to the ER if you experience any chest pains, shortness of breath, dizziness, visual changes, severe headache (unrelieved by pain meds), fever greater than 101, any heavy bleeding (saturating more than 1 pad per hour), large clots, or foul smelling discharge, any worsening abdominal pain and cramping that is not controlled by pain medication, any calf/leg pain or redness, any breast concerns (redness/pain), or any signs of postpartum depression. No tampons, enemas, douches, or sexual intercourse for 6 weeks. Also avoid tub baths, hot tubs, or swimming for 6 weeks.    Check your incision daily for any signs of infection such as redness, warmth, swelling, increased pain, our pus/foul smelling drainage  Activity: do not lift over 10 lbs for 6 weeks  No driving for 1-2 weeks  Pelvic rest for 6 weeks  

## 2019-07-05 NOTE — Discharge Summary (Signed)
Physician Obstetric Discharge Summary  Patient Name: Deanna Spears DOB: [birth year] MRN: [id number]                            Discharge Summary  Date of Admission: 07/03/2019 Date of Discharge: 07/05/2019 Delivering Provider: Harlin Heys   Admitting Diagnosis: Pregnancy [Z34.90] PROM (premature rupture of membranes) [O42.90] Macrosomia [P08.0] at [redacted]w[redacted]d Secondary diagnosis:  Active Problems:   Pregnancy   PROM (premature rupture of membranes)   Macrosomia   No prenatal care in current pregnancy in third trimester   Cesarean delivery delivered   Mode of Delivery:       low uterine, transverse      Post partum procedures: None  Complications:                      Discharge Day SOAP Note:  Subjective:  The patient has no complaints.  She is ambulating well. She is taking PO well. Pain is well controlled with current medications. Patient is urinating without difficulty.   She is passing flatus.    Objective  Vital signs: BP 115/65 (BP Location: Left Arm)   Pulse 84   Temp 98.4 F (36.9 C) (Oral)   Resp 18   Ht 5\' 4"  (1.626 m)   Wt 93 kg   LMP 09/11/2018 (Approximate)   SpO2 96%   Breastfeeding Unknown   BMI 35.19 kg/m   Physical Exam: Gen: NAD Abdomen:  clean, dry, no drainage Fundus Fundal Tone: Firm  Lochia Amount: Small     Data Review Labs: Lab Results  Component Value Date   WBC 13.3 (H) 07/04/2019   HGB 11.9 (L) 07/04/2019   HCT 35.9 (L) 07/04/2019   MCV 92.5 07/04/2019   PLT 227 07/04/2019   CBC Latest Ref Rng & Units 07/04/2019 07/03/2019  WBC 4.0 - 10.5 K/uL 13.3(H) 7.5  Hemoglobin 12.0 - 15.0 g/dL 11.9(L) 8.4(L)  Hematocrit 36.0 - 46.0 % 35.9(L) 25.7(L)  Platelets 150 - 400 K/uL 227 167   O POS Performed at [facility], [address] [place], [place] [address]   Flavia Shipper Score: Flavia Shipper Postnatal Depression Scale Screening Tool 07/04/2019  I have been able to laugh and see the funny side of things. 0  I  have looked forward with enjoyment to things. 0  I have blamed myself unnecessarily when things went wrong. 2  I have been anxious or worried for no good reason. 1  I have felt scared or panicky for no good reason. 0  Things have been getting on top of me. 0  I have been so unhappy that I have had difficulty sleeping. 0  I have felt sad or miserable. 0  I have been so unhappy that I have been crying. 0  The thought of harming myself has occurred to me. 0  Edinburgh Postnatal Depression Scale Total 3    Assessment:  Active Problems:   Pregnancy   PROM (premature rupture of membranes)   Macrosomia   No prenatal care in current pregnancy in third trimester   Cesarean delivery delivered   Doing well.  Normal progress as expected.  Plan:  Discharge to home  Modified rest as directed - may slowly resume normal activities with restrictions  as discussed.  Medications as written.  See below for additional.      Discharge Instructions: Per After Visit Summary. Activity: Advance as tolerated. Pelvic rest for 6  weeks.  Also refer to After Visit Summary.  Wound care discussed. Diet: Regular Medications:  Outpatient follow up:  Follow-up Information    Linzie Collin, MD Follow up in 1 week(s).   Specialties: Obstetrics and Gynecology, Radiology Contact information: [address] [place] [address] [phone]          Postpartum contraception: Will discuss at first post-partum visit.  Discharged Condition: good  Discharged to: home  Newborn Data: Disposition:home with mother  Apgars: APGAR (1 MIN): 2   APGAR (5 MINS): 8   APGAR (10 MINS):    Baby Feeding: Breast  Elonda Husky, M.D. 07/05/2019 1:42 PM

## 2021-12-12 IMAGING — US US OB COMP +14 WK
1 series · 14 of 26 positions shown · non-contrast
Comparison: none

CLINICAL DATA: No prenatal care.

EXAM:
LIMITED OBSTETRIC ULTRASOUND

[Series 1: us ob comp + 14 wk · 14 of 26 slices shown]
[im 1/26]
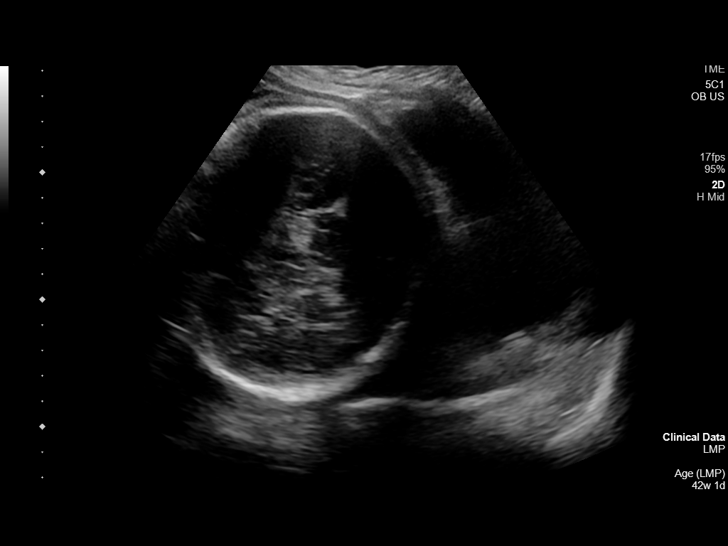
[im 3/26]
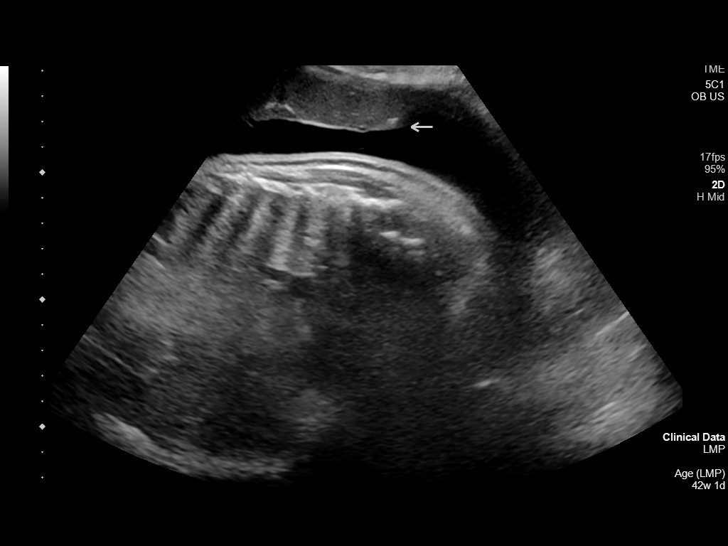
[im 5/26]
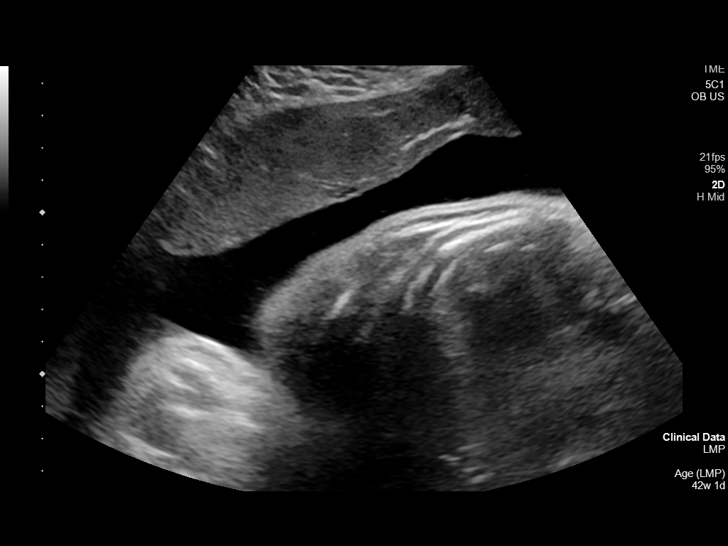
[im 7/26]
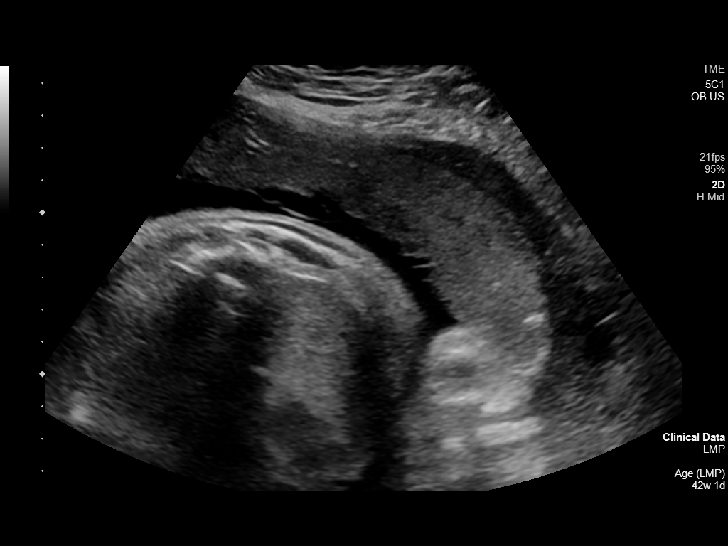
[im 9/26]
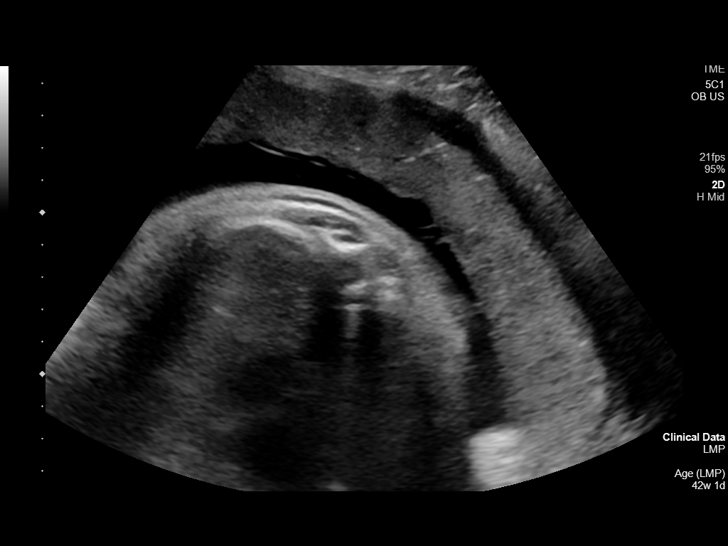
[im 11/26]
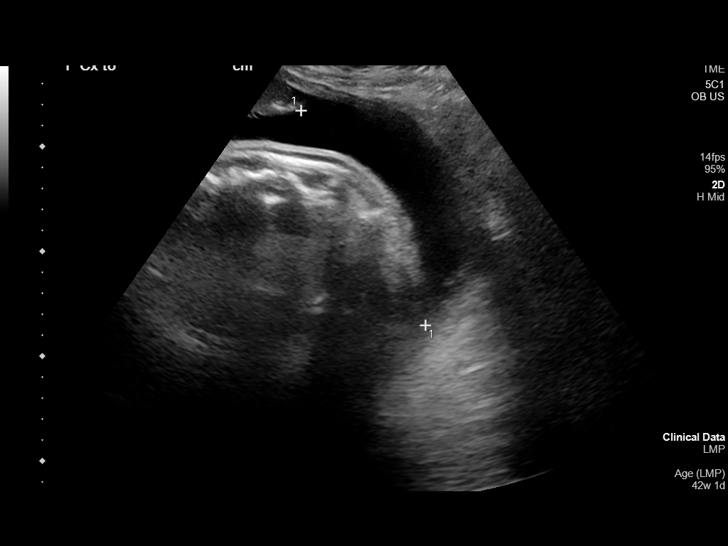
[im 13/26]
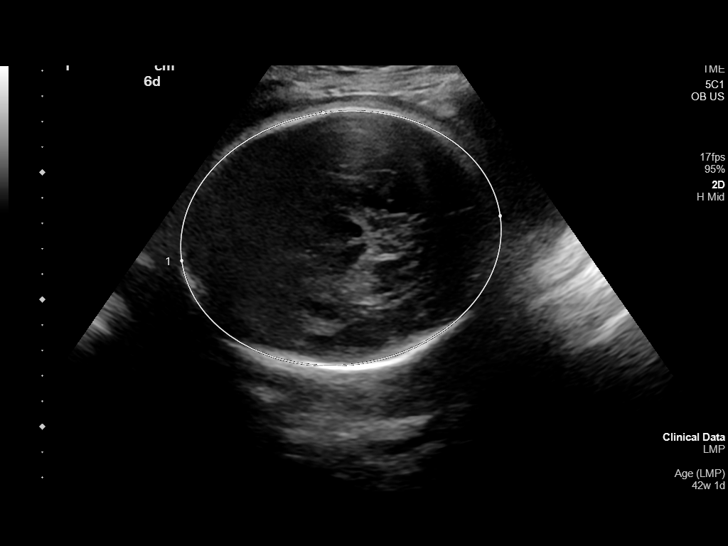
[im 14/26]
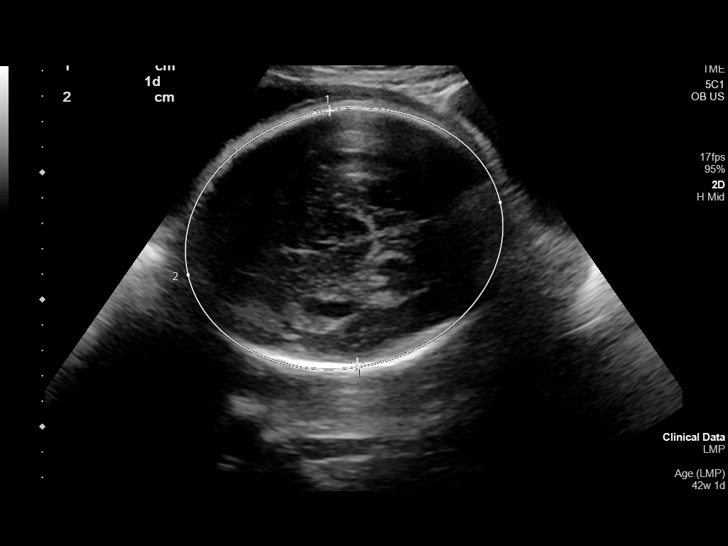
[im 16/26]
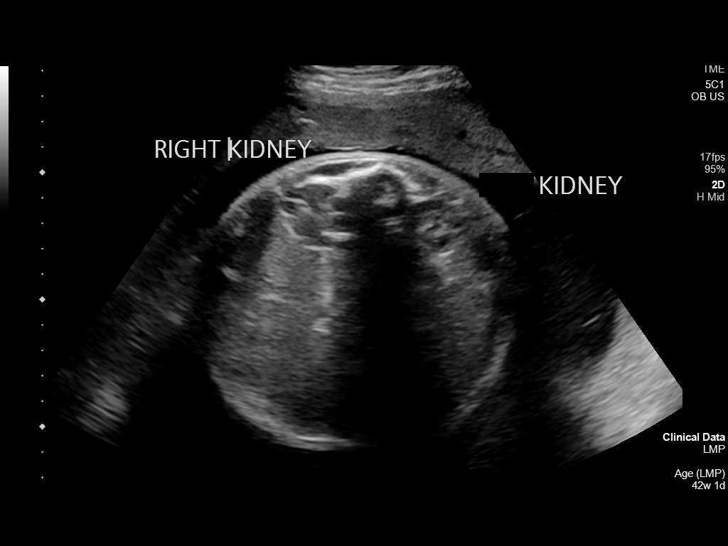
[im 18/26]
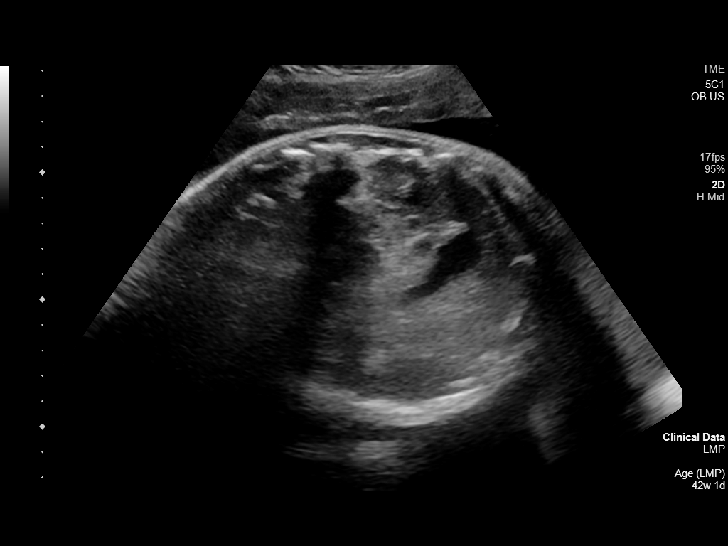
[im 20/26]
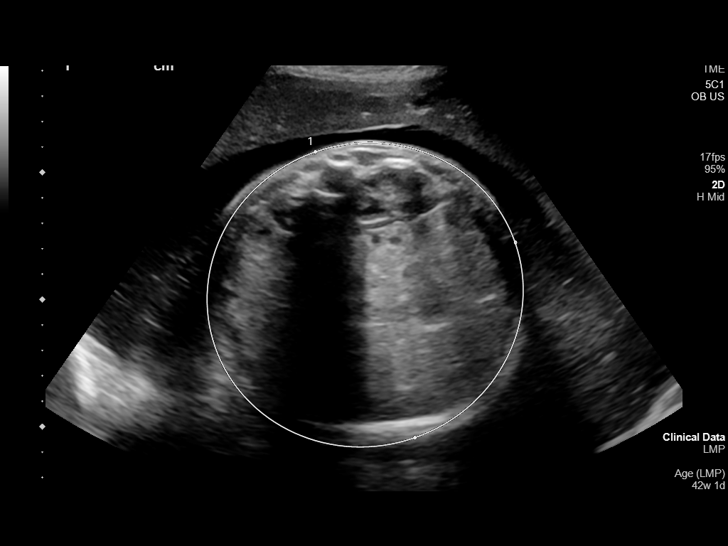
[im 22/26]
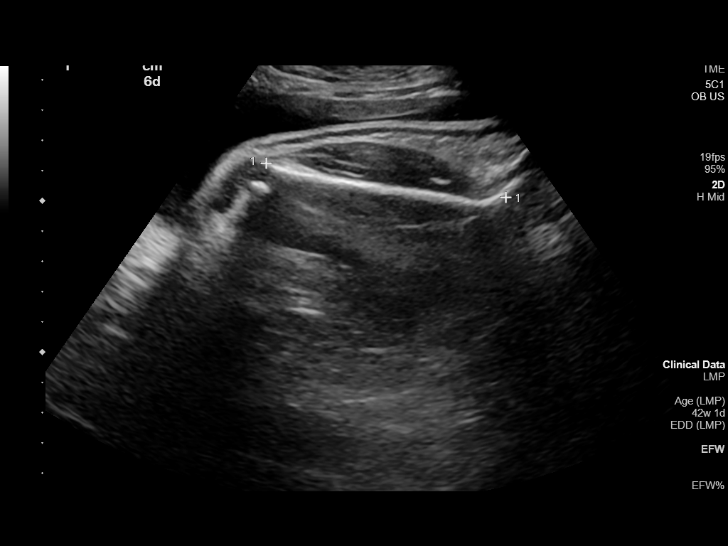
[im 24/26]
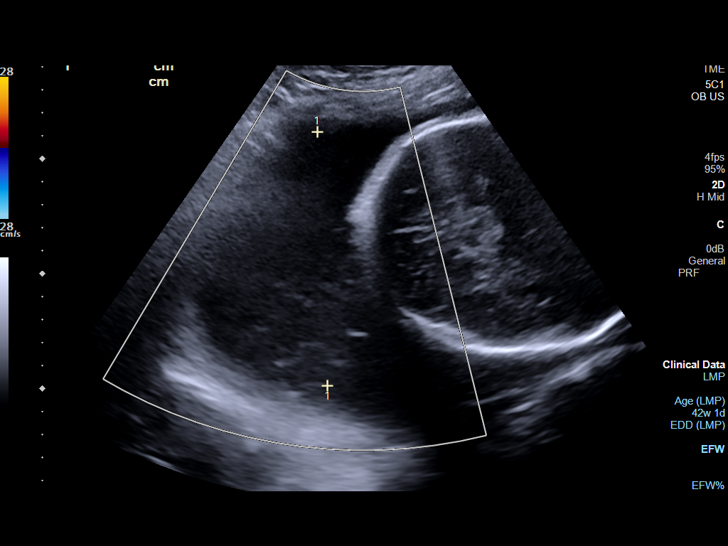
[im 26/26]
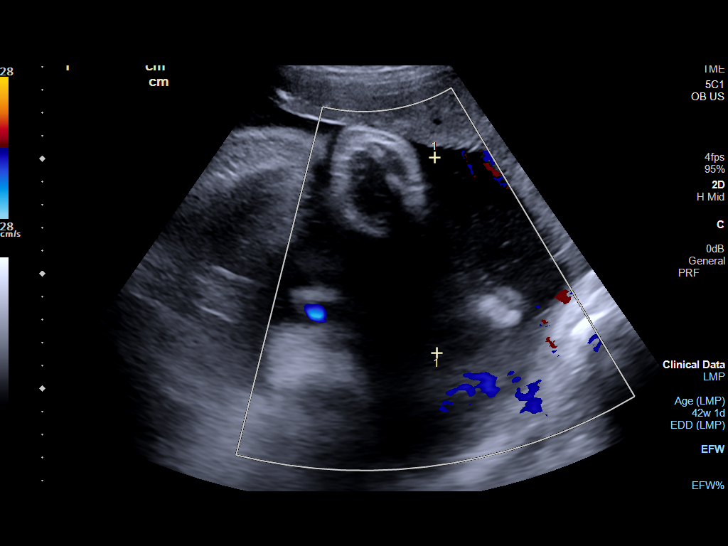

[14 of 26 positions shown; findings below may reference images not displayed]

FINDINGS: Number of Fetuses: 1

Heart Rate:  133 bpm

Movement: Visualized

Presentation: Cephalic

Placental Location: Anterior

Previa: No

Amniotic Fluid (Subjective):  Increased

AFI: 37.6 cm

BPD: 10.2 cm 41 w  2 d

FL: 8.01 cm 41 w  0 d

EFW: 4549.3 g >90%ile

MATERNAL FINDINGS:

Cervix:  Not assessed

Uterus/Adnexae: Limited due to to advanced gestational age
IMPRESSION: Approximately 41 week intrauterine gestation. Fetal heart rate 133
beats per minute. Cephalic presentation with anterior placenta. No
evidence of placenta previa.

Increased amniotic fluid as above.

This exam is performed on an emergent basis and does not
comprehensively evaluate fetal size, dating, or anatomy; follow-up
complete OB US should be considered if further fetal assessment is
warranted.
